# Patient Record
Sex: Male | Born: 1980 | Race: White | Hispanic: No | Marital: Single | Smoking: Current every day smoker
Health system: Southern US, Community
[De-identification: ages and names within clinical notes are randomized; demographics above are authoritative.]

## PROBLEM LIST (undated history)

## (undated) HISTORY — PX: FEMUR FRACTURE SURGERY: SHX633

## (undated) HISTORY — PX: HIP SURGERY: SHX245

---
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
## 1999-12-20 ENCOUNTER — Emergency Department (HOSPITAL_COMMUNITY): Admission: EM | Admit: 1999-12-20 | Discharge: 1999-12-20 | Payer: Self-pay | Admitting: Emergency Medicine

## 2000-02-16 ENCOUNTER — Emergency Department (HOSPITAL_COMMUNITY): Admission: EM | Admit: 2000-02-16 | Discharge: 2000-02-16 | Payer: Self-pay | Admitting: Emergency Medicine

## 2000-02-17 ENCOUNTER — Emergency Department (HOSPITAL_COMMUNITY): Admission: EM | Admit: 2000-02-17 | Discharge: 2000-02-17 | Payer: Self-pay | Admitting: Emergency Medicine

## 2008-06-14 ENCOUNTER — Emergency Department: Payer: Self-pay | Admitting: Emergency Medicine

## 2010-04-11 ENCOUNTER — Emergency Department: Payer: Self-pay | Admitting: Emergency Medicine

## 2010-04-16 ENCOUNTER — Emergency Department: Payer: Self-pay | Admitting: Emergency Medicine

## 2014-08-31 ENCOUNTER — Encounter (HOSPITAL_COMMUNITY): Payer: Self-pay | Admitting: Emergency Medicine

## 2014-08-31 DIAGNOSIS — Y999 Unspecified external cause status: Secondary | ICD-10-CM | POA: Insufficient documentation

## 2014-08-31 DIAGNOSIS — W228XXA Striking against or struck by other objects, initial encounter: Secondary | ICD-10-CM | POA: Insufficient documentation

## 2014-08-31 DIAGNOSIS — S62316A Displaced fracture of base of fifth metacarpal bone, right hand, initial encounter for closed fracture: Secondary | ICD-10-CM | POA: Insufficient documentation

## 2014-08-31 DIAGNOSIS — Y929 Unspecified place or not applicable: Secondary | ICD-10-CM | POA: Insufficient documentation

## 2014-08-31 DIAGNOSIS — Y939 Activity, unspecified: Secondary | ICD-10-CM | POA: Insufficient documentation

## 2014-08-31 DIAGNOSIS — Z720 Tobacco use: Secondary | ICD-10-CM | POA: Insufficient documentation

## 2014-08-31 NOTE — ED Notes (Signed)
Patient here with complaint of right hand injury secondary to punching a wall. Injury occurred about 1 week ago. Hand appears deformed. Tenderness to palpation proximal to 4th and 5th fingers.

## 2014-09-01 ENCOUNTER — Emergency Department (HOSPITAL_COMMUNITY)
Admission: EM | Admit: 2014-09-01 | Discharge: 2014-09-01 | Disposition: A | Discharge status: Home / Self Care | Payer: Self-pay | Attending: Emergency Medicine | Admitting: Emergency Medicine

## 2014-09-01 ENCOUNTER — Emergency Department (HOSPITAL_COMMUNITY): Payer: Self-pay

## 2014-09-01 DIAGNOSIS — S62308A Unspecified fracture of other metacarpal bone, initial encounter for closed fracture: Secondary | ICD-10-CM

## 2014-09-01 MED ORDER — TRAMADOL HCL 50 MG PO TABS: 50.0000 mg | ORAL_TABLET | Freq: Four times a day (QID) | ORAL | Status: DC | PRN

## 2014-09-01 NOTE — ED Provider Notes (Signed)
CSN: [id number]     Arrival date & time 08/31/14  2329 History   First MD Initiated Contact with Patient 09/01/14 0117     Chief Complaint  Patient presents with  . Hand Injury   Oscar Roman is a 34 y.o. male who presents to the ED complaining of right hand pain after punching a wall 7 days ago. He also reports that the tip of his left little finger is starting to feel numb. He reports he has previously injured this hand and has broken his 5th metacarpal before. He rates his pain at 8/10 and worse with movement. He has not taken anything for treatment today. He denies other injuries. He denies fevers, chills, shortness of breath, wheezing,   (Consider location/radiation/quality/duration/timing/severity/associated sxs/prior Treatment) HPI  History reviewed. No pertinent past medical history. Past Surgical History  Procedure Laterality Date  . Hip surgery    . Femur fracture surgery     History reviewed. No pertinent family history. History  Substance Use Topics  . Smoking status: Current Every Day Smoker -- 1.00 packs/day  . Smokeless tobacco: Not on file  . Alcohol Use: No    Review of Systems  Constitutional: Negative for fever.  Respiratory: Negative for cough, shortness of breath and wheezing.   Gastrointestinal: Negative for nausea, vomiting and abdominal pain.  Musculoskeletal: Positive for joint swelling. Negative for back pain and neck pain.       Right hand pain  Skin: Negative for rash.  Neurological: Negative for weakness, light-headedness and numbness.      Allergies  Review of patient's allergies indicates no known allergies.  Home Medications   Prior to Admission medications   Medication Sig Start Date End Date Taking? Authorizing Provider  traMADol (ULTRAM) 50 MG tablet Take 1 tablet (50 mg total) by mouth every 6 (six) hours as needed. 09/01/14   Everlene FarrierWilliam Rashiya Lofland, PA-C   BP 136/93 mmHg  Pulse 91  Temp(Src) 98.4 F (36.9 C) (Oral)  Resp 20  Ht 5\' 8"   (1.727 m)  Wt 185 lb (83.915 kg)  BMI 28.14 kg/m2  SpO2 100% Physical Exam  Constitutional: He appears well-developed and well-nourished. No distress.  HENT:  Head: Normocephalic and atraumatic.  Eyes: Conjunctivae are normal. Pupils are equal, round, and reactive to light. Right eye exhibits no discharge. Left eye exhibits no discharge.  Neck: Normal range of motion.  Cardiovascular: Normal rate, regular rhythm, normal heart sounds and intact distal pulses.   Bilateral radial pulses are intact.  Pulmonary/Chest: Effort normal and breath sounds normal. No respiratory distress. He has no wheezes. He has no rales.  Abdominal: Soft. There is no tenderness.  Musculoskeletal: He exhibits edema and tenderness.  Edema noted to the dorsal aspect of his right hand over his fourth and fifth metacarpals. He is tender over his fourth and fifth metacarpals. There is no open wound. The patient's radial, median and ulnar nerves are intact. Patient has slight decreased sensation in the distal tip of his right finger #5. Strong radial pulse. Good capillary refill. No wrist tenderness. Full ROM of his right wrist.   Lymphadenopathy:    He has no cervical adenopathy.  Neurological: He is alert. Coordination normal.  Skin: Skin is warm and dry. No rash noted. He is not diaphoretic. No erythema. No pallor.  Psychiatric: He has a normal mood and affect. His behavior is normal.  Nursing note and vitals reviewed.   ED Course  Procedures (including critical care time) Labs Review Labs Reviewed -  No data to display  Imaging Review Dg Hand Complete Right  09/01/2014   CLINICAL DATA:  Punched wall with right hand, with swelling and pain over the fifth metacarpal. Initial encounter.  EXAM: RIGHT HAND - COMPLETE 3+ VIEW  COMPARISON:  None.  FINDINGS: There is a mildly comminuted fracture near the base of the fifth metacarpal, with mild displacement. No definite intra-articular extension is seen, though it cannot be  excluded. Overlying soft tissue swelling is noted.  The joint spaces are otherwise preserved. The carpal rows are intact, and demonstrate normal alignment. Positive ulnar variance is noted.  IMPRESSION: 1. Mildly comminuted fracture near the base of fifth metacarpal, with mild displacement. No definite intra-articular extension seen, though it cannot be excluded. 2. Positive ulnar variance noted.   Electronically Signed   By: Roanna Raider M.D.   On: 09/01/2014 01:21     EKG Interpretation None      Filed Vitals:   08/31/14 2342  BP: 136/93  Pulse: 91  Temp: 98.4 F (36.9 C)  TempSrc: Oral  Resp: 20  Height:  (1.727 m)  Weight: 185 lb (83.915 kg)  SpO2: 100%     MDM   Meds given in ED:  Medications - No data to display  New Prescriptions   TRAMADOL (ULTRAM) 50 MG TABLET    Take 1 tablet (50 mg total) by mouth every 6 (six) hours as needed.    Final diagnoses:  Closed fracture of 5th metacarpal, initial encounter   This is a 34 year old male who presents the emergency department complaining of right hand pain after punching the wall 7 days ago. The patient has edema and tenderness to palpation over the dorsal aspect of his right hand over his fourth and fifth metacarpals. He has a strong radial pulse and capillary refill is normal. The patient's radial, median and ulnar nerves are intact. He does have very slight decreased sensation in the distal part of his fifth finger. X-ray indicates a mildly comminuted fracture near the base of the fifth metacarpal with mild displacement. There is no definite intra-articular extension seen. After discussing with my attending Dr. Preston Fleeting, will place in ulnar gutter splint and have him follow-up with hand surgeon Dr. Merlyn Lot. I advised the patient of this plan. We'll provide the patient with a prescription for tramadol. Patient did not wish for any pain medicine in the emergency department. I advised the patient to follow-up with hand surgery  this week. I advised the patient to return to the emergency department with new or worsening symptoms or new concerns. The patient verbalized understanding and agreement with plan.   This patient was discussed with Dr. Preston Fleeting who agrees with assessment and plan.    Everlene Farrier, PA-C 09/01/14 1610  Dione Booze, MD 09/01/14 [phone]

## 2014-09-01 NOTE — Discharge Instructions (Signed)
5th metacarpal fracture You have a break (fracture) of the fifth metacarpal bone at the base.  This is the bone in the hand where the little finger attaches. The fracture is in the end of that bone, closest to the little finger. It is usually caused when you hit an object with a clenched fist. Often, the knuckle is pushed down by the impact. Sometimes, the fracture rotates out of position. A boxer's fracture will usually heal within 6 weeks, if it is treated properly and protected from re-injury. Surgery is sometimes needed. A cast, splint, or bulky hand dressing may be used to protect and immobilize a boxer's fracture. Do not remove this device or dressing until your caregiver approves. Keep your hand elevated, and apply ice packs for 15-20 minutes every 2 hours, for the first 2 days. Elevation and ice help reduce swelling and relieve pain. See your caregiver, or an orthopedic specialist, for follow-up care within the next 10 days. This is to make sure your fracture is healing properly. Document Released: 06/02/2005 Document Revised: 08/25/2011 Document Reviewed: 11/20/2006 [facility] Patient Information 2015 [place], [place]. This information is not intended to replace advice given to you by your health care provider. Make sure you discuss any questions you have with your health care provider.  Cast or Splint Care Casts and splints support injured limbs and keep bones from moving while they heal. It is important to care for your cast or splint at home.  HOME CARE INSTRUCTIONS  Keep the cast or splint uncovered during the drying period. It can take 24 to 48 hours to dry if it is made of plaster. A fiberglass cast will dry in less than 1 hour.  Do not rest the cast on anything harder than a pillow for the first 24 hours.  Do not put weight on your injured limb or apply pressure to the cast until your health care provider gives you permission.  Keep the cast or splint dry. Wet casts or splints can lose  their shape and may not support the limb as well. A wet cast that has lost its shape can also create harmful pressure on your skin when it dries. Also, wet skin can become infected.  Cover the cast or splint with a plastic bag when bathing or when out in the rain or snow. If the cast is on the trunk of the body, take sponge baths until the cast is removed.  If your cast does become wet, dry it with a towel or a blow dryer on the cool setting only.  Keep your cast or splint clean. Soiled casts may be wiped with a moistened cloth.  Do not place any hard or soft foreign objects under your cast or splint, such as cotton, toilet paper, lotion, or powder.  Do not try to scratch the skin under the cast with any object. The object could get stuck inside the cast. Also, scratching could lead to an infection. If itching is a problem, use a blow dryer on a cool setting to relieve discomfort.  Do not trim or cut your cast or remove padding from inside of it.  Exercise all joints next to the injury that are not immobilized by the cast or splint. For example, if you have a long leg cast, exercise the hip joint and toes. If you have an arm cast or splint, exercise the shoulder, elbow, thumb, and fingers.  Elevate your injured arm or leg on 1 or 2 pillows for the first 1  to 3 days to decrease swelling and pain.It is best if you can comfortably elevate your cast so it is higher than your heart. SEEK MEDICAL CARE IF:   Your cast or splint cracks.  Your cast or splint is too tight or too loose.  You have unbearable itching inside the cast.  Your cast becomes wet or develops a soft spot or area.  You have a bad smell coming from inside your cast.  You get an object stuck under your cast.  Your skin around the cast becomes red or raw.  You have new pain or worsening pain after the cast has been applied. SEEK IMMEDIATE MEDICAL CARE IF:   You have fluid leaking through the cast.  You are unable to  move your fingers or toes.  You have discolored (blue or white), cool, painful, or very swollen fingers or toes beyond the cast.  You have tingling or numbness around the injured area.  You have severe pain or pressure under the cast.  You have any difficulty with your breathing or have shortness of breath.  You have chest pain. Document Released: 05/30/2000 Document Revised: 03/23/2013 Document Reviewed: 12/09/2012 [facility] Patient Information 2015 [place]. This information is not intended to replace advice given to you by your health care provider. Make sure you discuss any questions you have with your health care provider.

## 2014-09-09 ENCOUNTER — Encounter (HOSPITAL_COMMUNITY): Payer: Self-pay | Admitting: Emergency Medicine

## 2014-09-09 ENCOUNTER — Emergency Department (HOSPITAL_COMMUNITY)
Admission: EM | Admit: 2014-09-09 | Discharge: 2014-09-09 | Disposition: A | Discharge status: Home / Self Care | Payer: Self-pay | Attending: Emergency Medicine | Admitting: Emergency Medicine

## 2014-09-09 DIAGNOSIS — K088 Other specified disorders of teeth and supporting structures: Secondary | ICD-10-CM | POA: Insufficient documentation

## 2014-09-09 DIAGNOSIS — Z720 Tobacco use: Secondary | ICD-10-CM | POA: Insufficient documentation

## 2014-09-09 DIAGNOSIS — K0889 Other specified disorders of teeth and supporting structures: Secondary | ICD-10-CM

## 2014-09-09 DIAGNOSIS — J029 Acute pharyngitis, unspecified: Secondary | ICD-10-CM | POA: Insufficient documentation

## 2014-09-09 DIAGNOSIS — H9209 Otalgia, unspecified ear: Secondary | ICD-10-CM | POA: Insufficient documentation

## 2014-09-09 LAB — RAPID STREP SCREEN (MED CTR MEBANE ONLY): Streptococcus, Group A Screen (Direct): NEGATIVE

## 2014-09-09 MED ORDER — PENICILLIN V POTASSIUM 250 MG PO TABS: 250.0000 mg | ORAL_TABLET | Freq: Four times a day (QID) | ORAL | Status: AC

## 2014-09-09 MED ORDER — HYDROCODONE-ACETAMINOPHEN 5-325 MG PO TABS: 1.0000 | ORAL_TABLET | Freq: Four times a day (QID) | ORAL | Status: DC | PRN

## 2014-09-09 NOTE — ED Provider Notes (Signed)
CSN: [id number]     Arrival date & time 09/09/14  1627 History  This chart was scribed for Roxy Horsemanobert Skiler Tye, PA-C working with Donnetta HutchingBrian Cook, MD by Evon Slackerrance Branch, ED Scribe. This patient was seen in room TR08C/TR08C and the patient's care was started at 4:44 PM.     Chief Complaint  Patient presents with  . Dental Pain  . Otalgia  . Sore Throat   The history is provided by the patient. No language interpreter was used.   HPI Comments: Oscar Roman is a 34 y.o. male who presents to the Emergency Department complaining of left sided dental pain onset 5 days prior. Pt states that the pain is radiating up into his left ear. Pt reports sore throat as well. Pt states that it is difficulty to swallow due to the pain. Pt states that he has tried tylenol and tramadol with no relief. Pt has also tried salt water gargles with no relief. Pt states that he feels as if he is developing a dental abscess and is requesting antibiotics. Pt denies fever, chills or other related symptoms.   History reviewed. No pertinent past medical history. Past Surgical History  Procedure Laterality Date  . Hip surgery    . Femur fracture surgery     No family history on file. History  Substance Use Topics  . Smoking status: Current Every Day Smoker -- 1.00 packs/day  . Smokeless tobacco: Not on file  . Alcohol Use: No    Review of Systems  Constitutional: Negative for fever and chills.  HENT: Positive for dental problem, ear pain and sore throat.   All other systems reviewed and are negative.    Allergies  Review of patient's allergies indicates no known allergies.  Home Medications   Prior to Admission medications   Medication Sig Start Date End Date Taking? Authorizing Provider  traMADol (ULTRAM) 50 MG tablet Take 1 tablet (50 mg total) by mouth every 6 (six) hours as needed. 09/01/14   Everlene FarrierWilliam Dansie, PA-C   BP 162/104 mmHg  Pulse 89  Temp(Src) 98.3 F (36.8 C) (Oral)  Resp 16  Ht 5\' 8"  (1.727 m)   Wt 180 lb (81.647 kg)  BMI 27.38 kg/m2  SpO2 99%   Physical Exam  Constitutional: He is oriented to person, place, and time. He appears well-developed and well-nourished. No distress.  HENT:  Head: Normocephalic and atraumatic.  Mouth/Throat:    Poor dentition throughout.  Affected tooth as diagrammed.  No signs of peritonsillar or tonsillar abscess.  No signs of gingival abscess. Oropharynx is clear and without exudates.  Uvula is midline.  Airway is intact. No signs of Ludwig's angina with palpation of oral and sublingual mucosa.   Eyes: Conjunctivae and EOM are normal.  Neck: Neck supple. No tracheal deviation present.  Cardiovascular: Normal rate.   Pulmonary/Chest: Effort normal. No respiratory distress.  Musculoskeletal: Normal range of motion.  Neurological: He is alert and oriented to person, place, and time.  Skin: Skin is warm and dry.  Psychiatric: He has a normal mood and affect. His behavior is normal.  Nursing note and vitals reviewed.   ED Course  Procedures (including critical care time) DIAGNOSTIC STUDIES: Oxygen Saturation is 99% on RA, normal by my interpretation.    COORDINATION OF CARE: 5:00 PM-Discussed treatment plan with pt at bedside and pt agreed to plan.     Labs Review Labs Reviewed  RAPID STREP SCREEN    Imaging Review No results found.   EKG  Interpretation None      MDM   Final diagnoses:  Pain, dental    Patient with toothache.  No gross abscess.  Exam unconcerning for Ludwig's angina or spread of infection.  Will treat with penicillin and pain medicine.  Urged patient to follow-up with dentist.    I personally performed the services described in this documentation, which was scribed in my presence. The recorded information has been reviewed and is accurate.       Roxy Horseman, PA-C 09/09/14 1717  Donnetta Hutching, MD 09/09/14 [phone]

## 2014-09-09 NOTE — Discharge Instructions (Signed)

## 2014-09-09 NOTE — ED Notes (Addendum)
Pt c/o pain to teeth on left side, also pain to left ear and sore throat. Pt has tried tylenol and tramadol without relief.

## 2014-09-11 LAB — CULTURE, GROUP A STREP: Strep A Culture: NEGATIVE

## 2014-09-12 ENCOUNTER — Telehealth (HOSPITAL_COMMUNITY): Payer: Self-pay

## 2018-10-02 ENCOUNTER — Emergency Department
Admission: EM | Admit: 2018-10-02 | Discharge: 2018-10-02 | Disposition: A | Discharge status: Home / Self Care | Payer: Self-pay | Attending: Emergency Medicine | Admitting: Emergency Medicine

## 2018-10-02 ENCOUNTER — Other Ambulatory Visit: Payer: Self-pay

## 2018-10-02 ENCOUNTER — Emergency Department: Payer: Self-pay

## 2018-10-02 ENCOUNTER — Encounter: Payer: Self-pay | Admitting: Emergency Medicine

## 2018-10-02 DIAGNOSIS — Y9389 Activity, other specified: Secondary | ICD-10-CM | POA: Insufficient documentation

## 2018-10-02 DIAGNOSIS — W11XXXA Fall on and from ladder, initial encounter: Secondary | ICD-10-CM | POA: Insufficient documentation

## 2018-10-02 DIAGNOSIS — Y929 Unspecified place or not applicable: Secondary | ICD-10-CM | POA: Insufficient documentation

## 2018-10-02 DIAGNOSIS — M79671 Pain in right foot: Secondary | ICD-10-CM | POA: Insufficient documentation

## 2018-10-02 DIAGNOSIS — Y999 Unspecified external cause status: Secondary | ICD-10-CM | POA: Insufficient documentation

## 2018-10-02 MED ORDER — MELOXICAM 15 MG PO TABS: 15.0000 mg | ORAL_TABLET | Freq: Every day | ORAL | 1 refills | Status: AC

## 2018-10-02 MED ORDER — HYDROCODONE-ACETAMINOPHEN 5-325 MG PO TABS: 1.0000 | ORAL_TABLET | Freq: Four times a day (QID) | ORAL | 0 refills | Status: AC | PRN

## 2018-10-02 MED ORDER — ONDANSETRON 4 MG PO TBDP
4.0000 mg | ORAL_TABLET | Freq: Once | ORAL | Status: AC
  Administered 2018-10-02: 4 mg via ORAL
  Filled 2018-10-02: qty 1

## 2018-10-02 MED ORDER — MORPHINE SULFATE (PF) 4 MG/ML IV SOLN
4.0000 mg | Freq: Once | INTRAVENOUS | Status: AC
  Administered 2018-10-02: 17:00:00 4 mg via INTRAMUSCULAR
  Filled 2018-10-02: qty 1

## 2018-10-02 NOTE — ED Provider Notes (Signed)
[facility] Emergency Department Provider Note  ____________________________________________  Time seen: Approximately 6:06 PM  I have reviewed the triage vital signs and the nursing notes.   HISTORY  Chief Complaint Foot Injury    HPI Oscar Roman is a 38 y.o. male presents to the emergency department with 10 out of 10 pain along the dorsal aspect of the right foot.  Patient reports that he was approximately 2 rungs up on the ladder when his foot twisted and became caught.  Patient fell to the ground but did not hit his head or his neck.  Patient has noticed significant swelling along the dorsal aspect of the foot since injury occurred and has had difficulty bearing weight.  No numbness or tingling in the right foot.  Patient denies a history of right foot issues in the past. No other alleviating measures have been attempted.    History reviewed. No pertinent past medical history.  There are no active problems to display for this patient.   Past Surgical History:  Procedure Laterality Date  . FEMUR FRACTURE SURGERY    . HIP SURGERY      Prior to Admission medications   Medication Sig Start Date End Date Taking? Authorizing Provider  HYDROcodone-acetaminophen (NORCO) 5-325 MG tablet Take 1 tablet by mouth every 6 (six) hours as needed for up to 3 days for moderate pain. 10/02/18 10/05/18  Orvil FeilWoods, Graciella Arment M, PA-C  meloxicam (MOBIC) 15 MG tablet Take 1 tablet (15 mg total) by mouth daily for 7 days. 10/02/18 10/09/18  Orvil FeilWoods, Nariya Neumeyer M, PA-C  traMADol (ULTRAM) 50 MG tablet Take 1 tablet (50 mg total) by mouth every 6 (six) hours as needed. 09/01/14   Everlene Farrieransie, William, PA-C    Allergies Patient has no known allergies.  History reviewed. No pertinent family history.  Social History Social History   Tobacco Use  . Smoking status: Current Every Day Smoker    Packs/day: 1.00  . Smokeless tobacco: Never Used  Substance Use Topics  . Alcohol use: No  . Drug  use: No     Review of Systems  Constitutional: No fever/chills Eyes: No visual changes. No discharge ENT: No upper respiratory complaints. Cardiovascular: no chest pain. Respiratory: no cough. No SOB. Gastrointestinal: No abdominal pain.  No nausea, no vomiting.  No diarrhea.  No constipation. Musculoskeletal: Patient has right foot pain.  Skin: Negative for rash, abrasions, lacerations, ecchymosis. Neurological: Negative for headaches, focal weakness or numbness.  ____________________________________________   PHYSICAL EXAM:  VITAL SIGNS: ED Triage Vitals  Enc Vitals Group     BP 10/02/18 1632 (!) 141/86     Pulse Rate 10/02/18 1632 (!) 104     Resp 10/02/18 1632 16     Temp 10/02/18 1632 98.2 F (36.8 C)     Temp Source 10/02/18 1632 Oral     SpO2 10/02/18 1632 96 %     Weight 10/02/18 1626 185 lb (83.9 kg)     Height 10/02/18 1626 5\' 8"  (1.727 m)     Head Circumference --      Peak Flow --      Pain Score 10/02/18 1625 10     Pain Loc --      Pain Edu? --      Excl. in GC? --      Constitutional: Alert and oriented. Well appearing and in no acute distress. Eyes: Conjunctivae are normal. PERRL. EOMI. Head: Atraumatic. Cardiovascular: Normal rate, regular rhythm. Normal S1 and S2.  Good peripheral circulation. Respiratory: Normal respiratory effort without tachypnea or retractions. Lungs CTAB. Good air entry to the bases with no decreased or absent breath sounds. Musculoskeletal: Patient is able to perform full range of motion at the right ankle.  He is able to move all 5 right toes.  He has significant swelling along the dorsal aspect of the right foot.  Palpable dorsalis pedis pulse, right. Neurologic:  Normal speech and language. No gross focal neurologic deficits are appreciated.  Skin:  Skin is warm, dry and intact. No rash noted. Psychiatric: Mood and affect are normal. Speech and behavior are normal. Patient exhibits appropriate insight and  judgement.   ____________________________________________   LABS (all labs ordered are listed, but only abnormal results are displayed)  Labs Reviewed - No data to display ____________________________________________  EKG   ____________________________________________  RADIOLOGY I personally viewed and evaluated these images as part of my medical decision making, as well as reviewing the written report by the radiologist  Dg Ankle Complete Right  Result Date: 10/02/2018 CLINICAL DATA:  Pain after fall. EXAM: RIGHT ANKLE - COMPLETE 3+ VIEW COMPARISON:  None. FINDINGS: There is no evidence of fracture, dislocation, or joint effusion. There is no evidence of arthropathy or other focal bone abnormality. Soft tissues are unremarkable. IMPRESSION: Negative. Electronically Signed   By: Gerome Sam III M.D   On: 10/02/2018 17:04   Dg Foot Complete Right  Result Date: 10/02/2018 CLINICAL DATA:  Pain after fall EXAM: RIGHT FOOT COMPLETE - 3+ VIEW COMPARISON:  None. FINDINGS: There is no evidence of fracture or dislocation. There is no evidence of arthropathy or other focal bone abnormality. Soft tissues are unremarkable. IMPRESSION: Negative. Electronically Signed   By: Gerome Sam III M.D   On: 10/02/2018 17:05    ____________________________________________    PROCEDURES  Procedure(s) performed:    Procedures    Medications  morphine 4 MG/ML injection 4 mg (4 mg Intramuscular Given 10/02/18 1650)  ondansetron (ZOFRAN-ODT) disintegrating tablet 4 mg (4 mg Oral Given 10/02/18 1648)     ____________________________________________   INITIAL IMPRESSION / ASSESSMENT AND PLAN / ED COURSE  Pertinent labs & imaging results that were available during my care of the patient were reviewed by me and considered in my medical decision making (see chart for details).  Review of the [place] CSRS was performed in accordance of the [facility] prior to dispensing any controlled  drugs.  Clinical Course as of Oct 01 1804  Sat Oct 02, 2018  1707 DG Ankle Complete Right [JW]    Clinical Course User Index [JW] Pia Mau M, PA-C     Assessment and plan Right foot pain Patient presents to the emergency department with acute right foot pain after his foot became twisted on a ladder.  On physical exam, patient was tearful and in exquisite pain.  Patient had significant swelling along the dorsal aspect of the right foot.  X-ray examination of the right foot and right ankle revealed no acute bony abnormality.  Patient was placed in a postop shoe and crutches were provided.  He was discharged with a short course of Norco and meloxicam.  He was advised to follow-up with podiatry.  All patient questions were answered.    ____________________________________________  FINAL CLINICAL IMPRESSION(S) / ED DIAGNOSES  Final diagnoses:  Right foot pain      NEW MEDICATIONS STARTED DURING THIS VISIT:  ED Discharge Orders         Ordered    HYDROcodone-acetaminophen (NORCO)  5-325 MG tablet  Every 6 hours PRN     10/02/18 1735    meloxicam (MOBIC) 15 MG tablet  Daily     10/02/18 1735              This chart was dictated using voice recognition software/Dragon. Despite best efforts to proofread, errors can occur which can change the meaning. Any change was purely unintentional.    Orvil Feil, PA-C 10/02/18 1812    Sharman Cheek, MD 10/04/18 [phone]

## 2018-10-02 NOTE — ED Triage Notes (Signed)
Pt was coming down ladder and last couple steps he missed and landed funny on foot.  Significant swelling to right foot.

## 2019-12-21 IMAGING — DX RIGHT ANKLE - COMPLETE 3+ VIEW
3 series · 3 of 3 positions shown · non-contrast
Comparison: None.

CLINICAL DATA: Pain after fall.

EXAM:
RIGHT ANKLE - COMPLETE 3+ VIEW

[ankle ap]
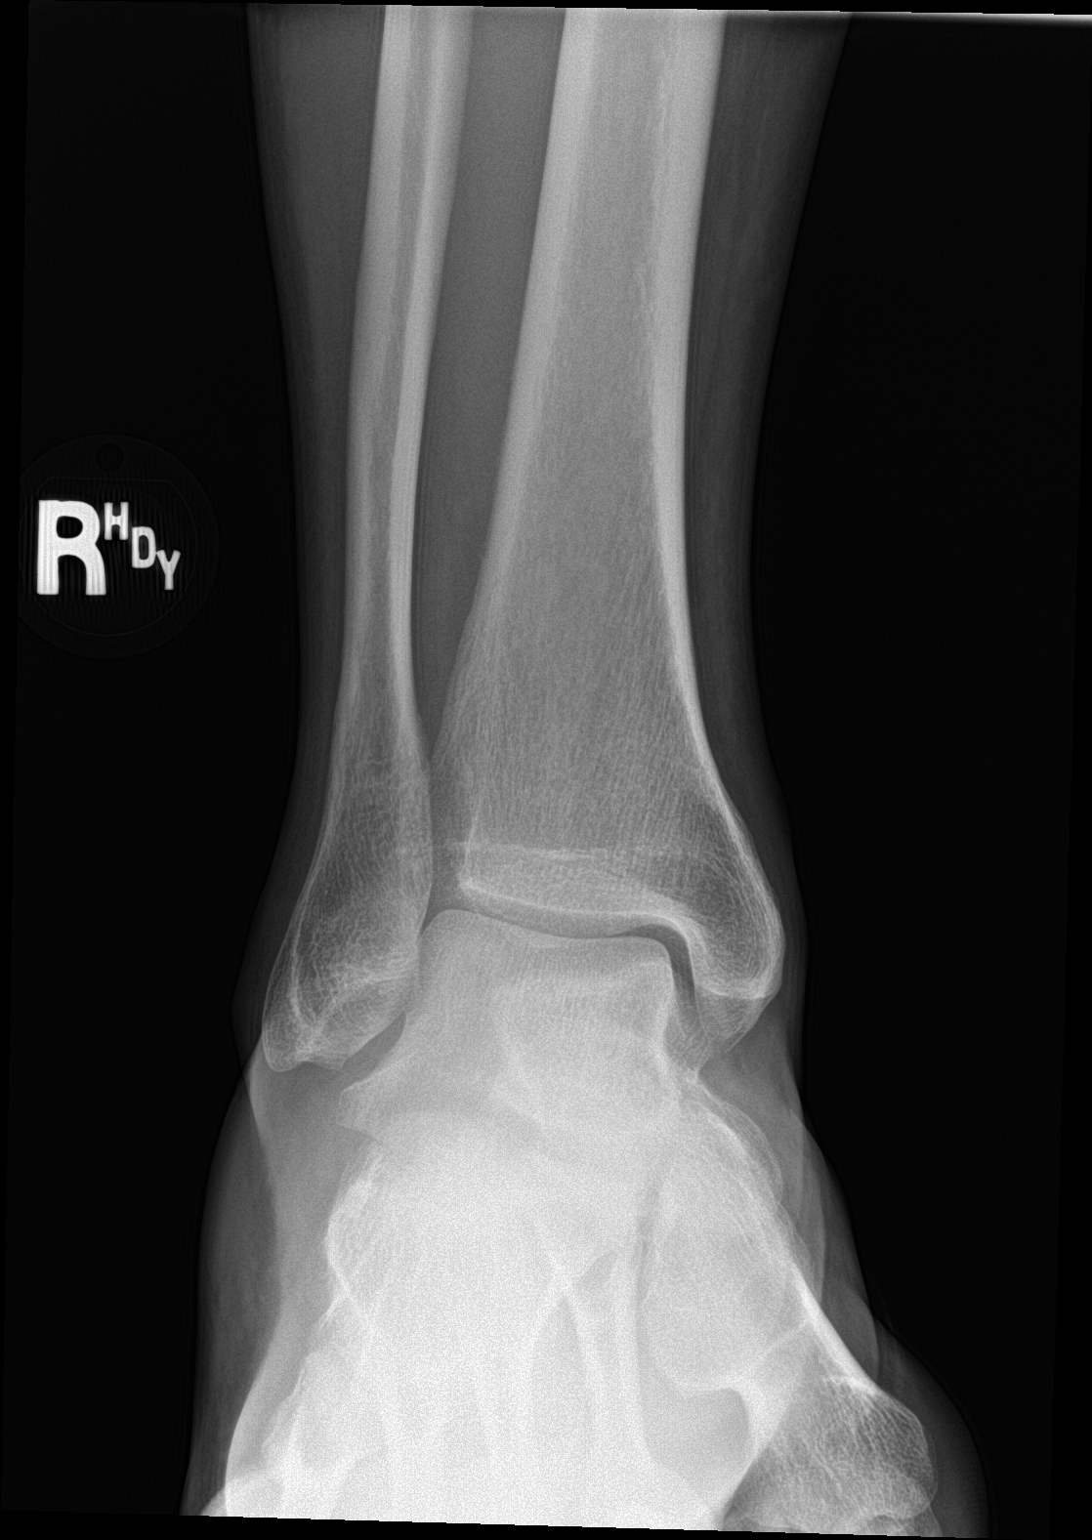

[ankle obl]
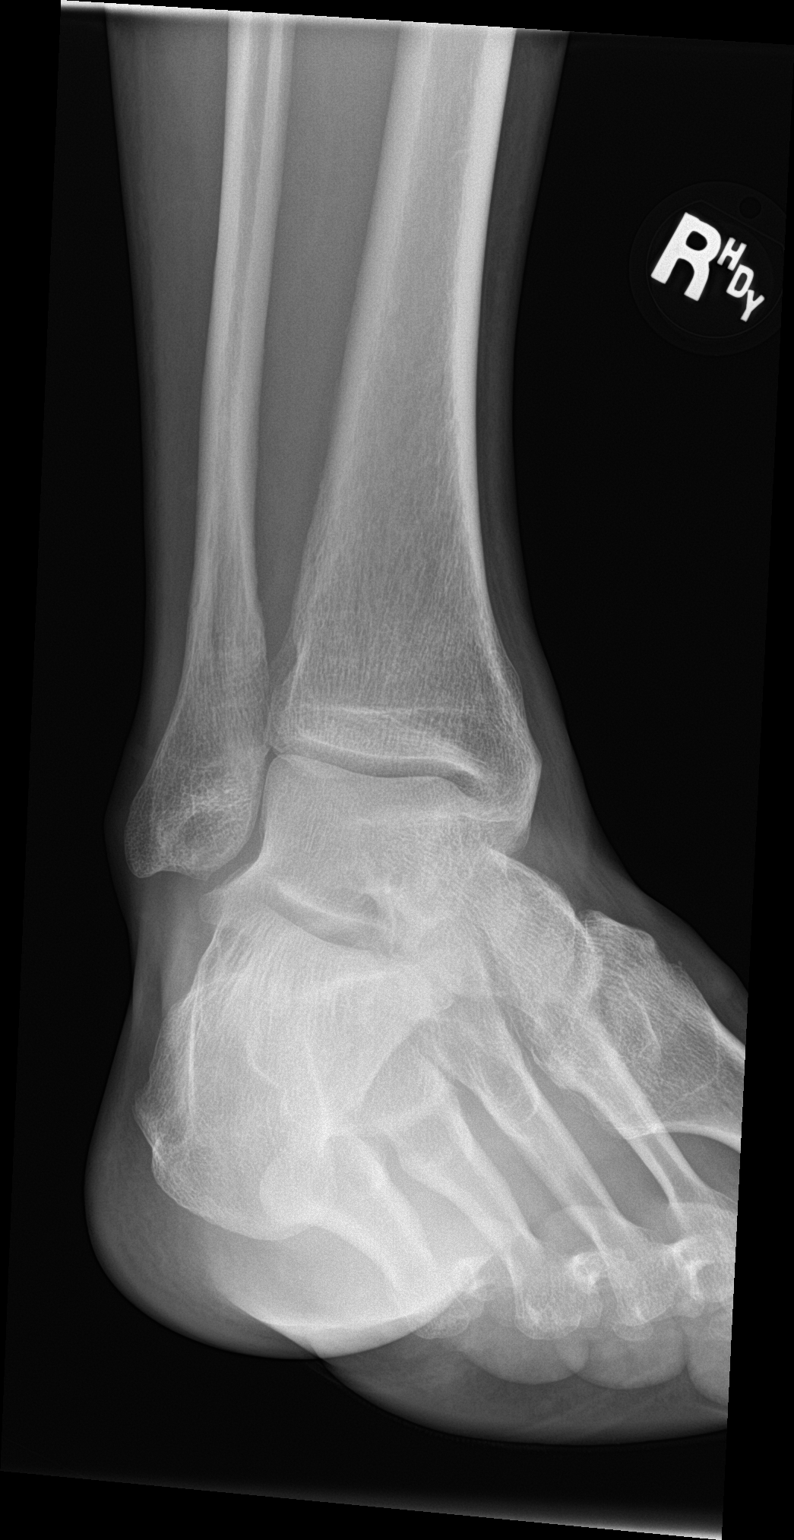

[ankle lat]
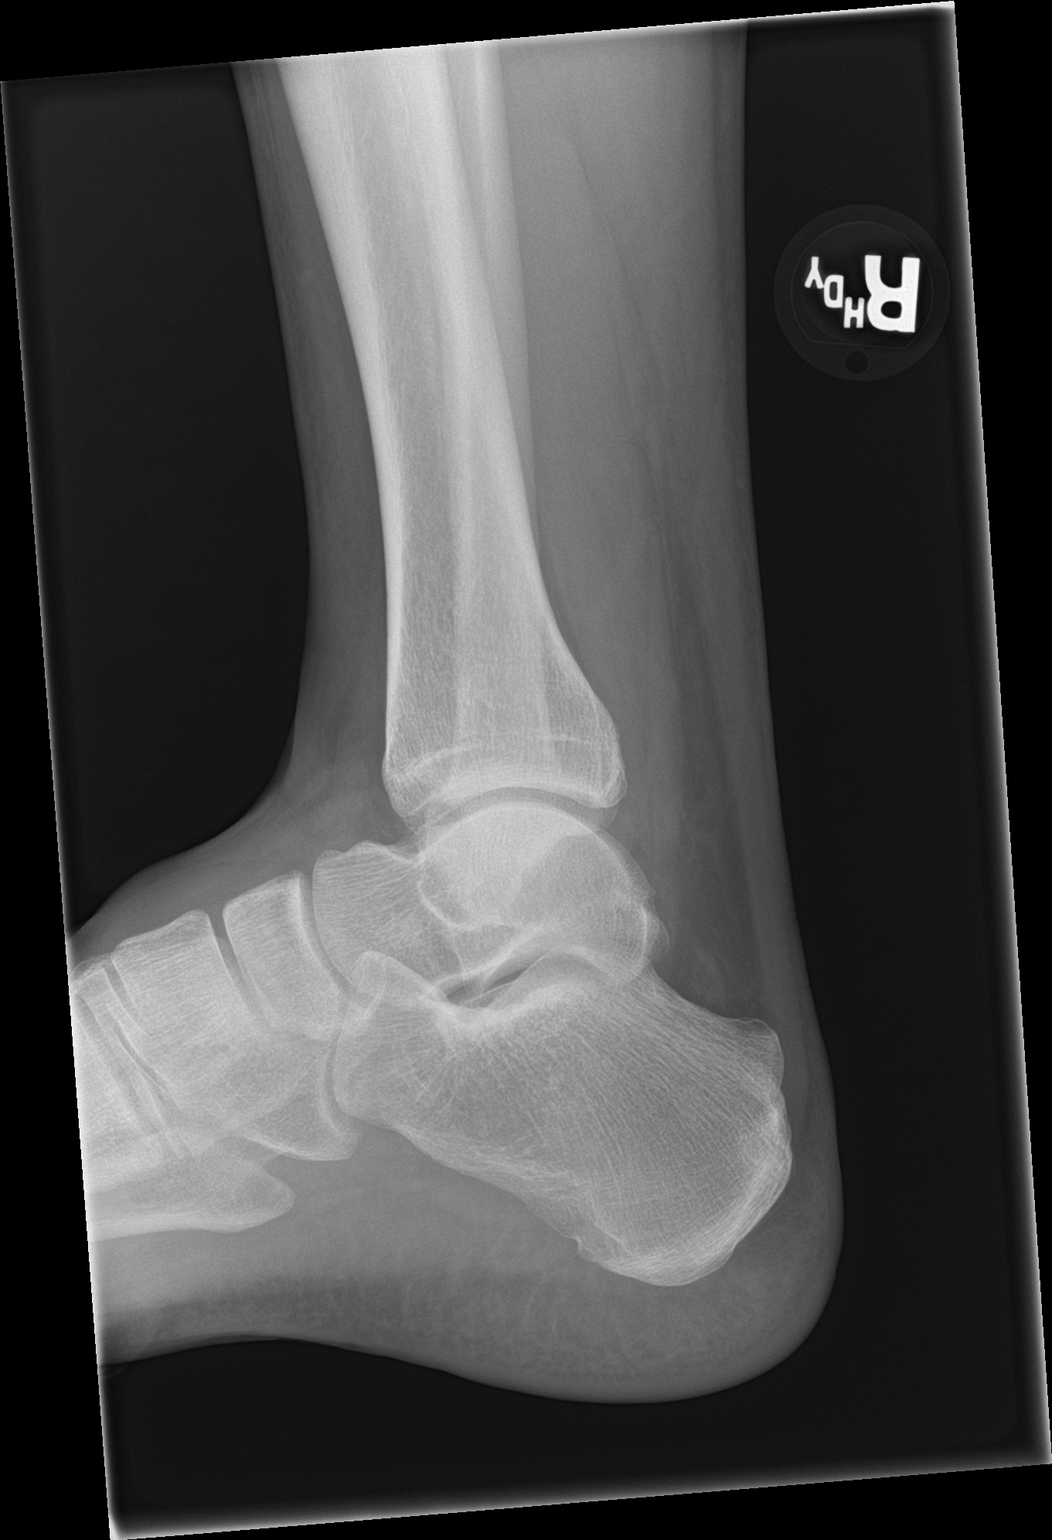

[3 of 3 positions shown; findings below may reference images not displayed]

FINDINGS: There is no evidence of fracture, dislocation, or joint effusion.
There is no evidence of arthropathy or other focal bone abnormality.
Soft tissues are unremarkable.
IMPRESSION: Negative.

## 2019-12-21 IMAGING — DX RIGHT FOOT COMPLETE - 3+ VIEW
3 series · 3 of 3 positions shown · non-contrast
Comparison: None.

CLINICAL DATA: Pain after fall

EXAM:
RIGHT FOOT COMPLETE - 3+ VIEW

[foot ap]
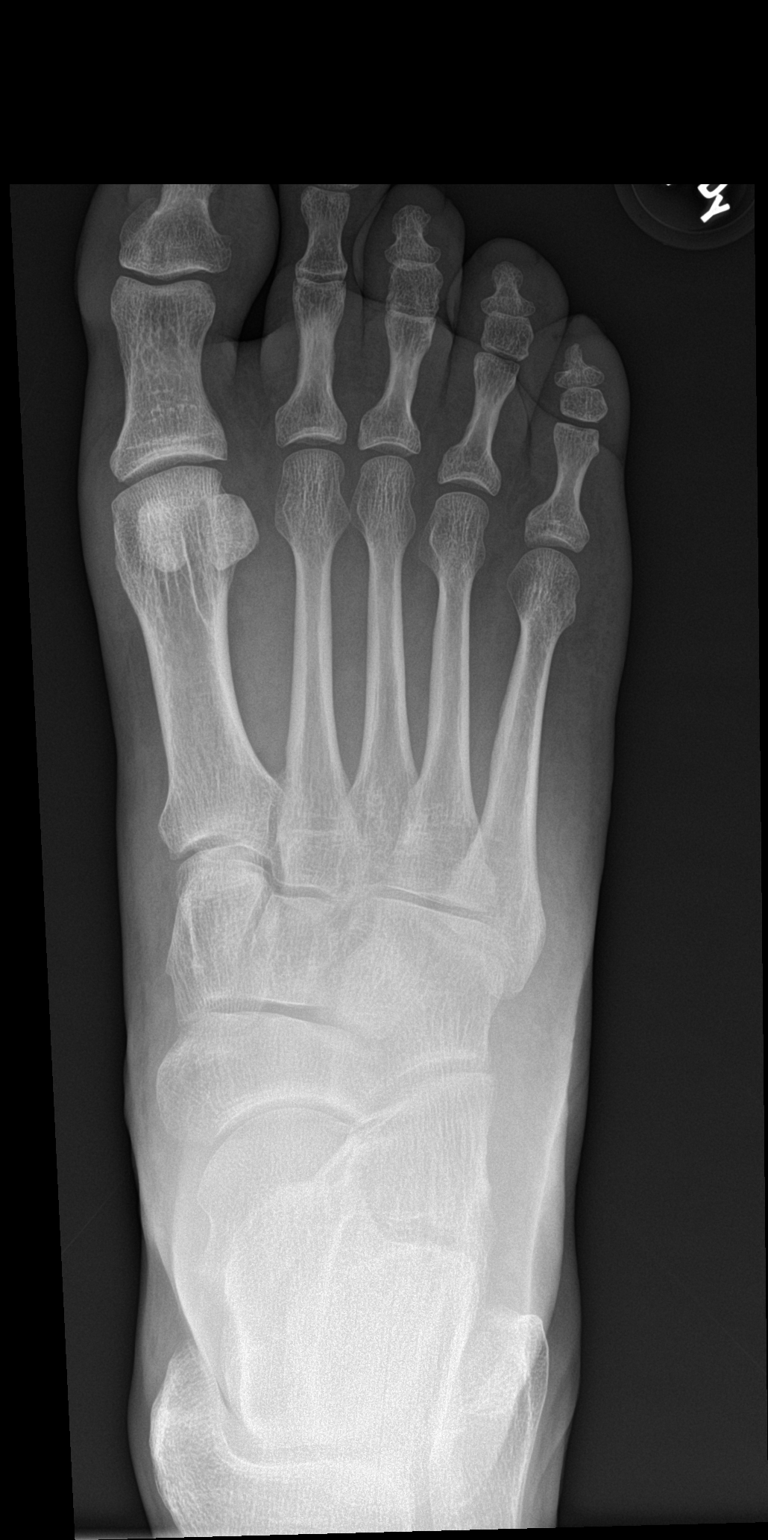

[foot obl]
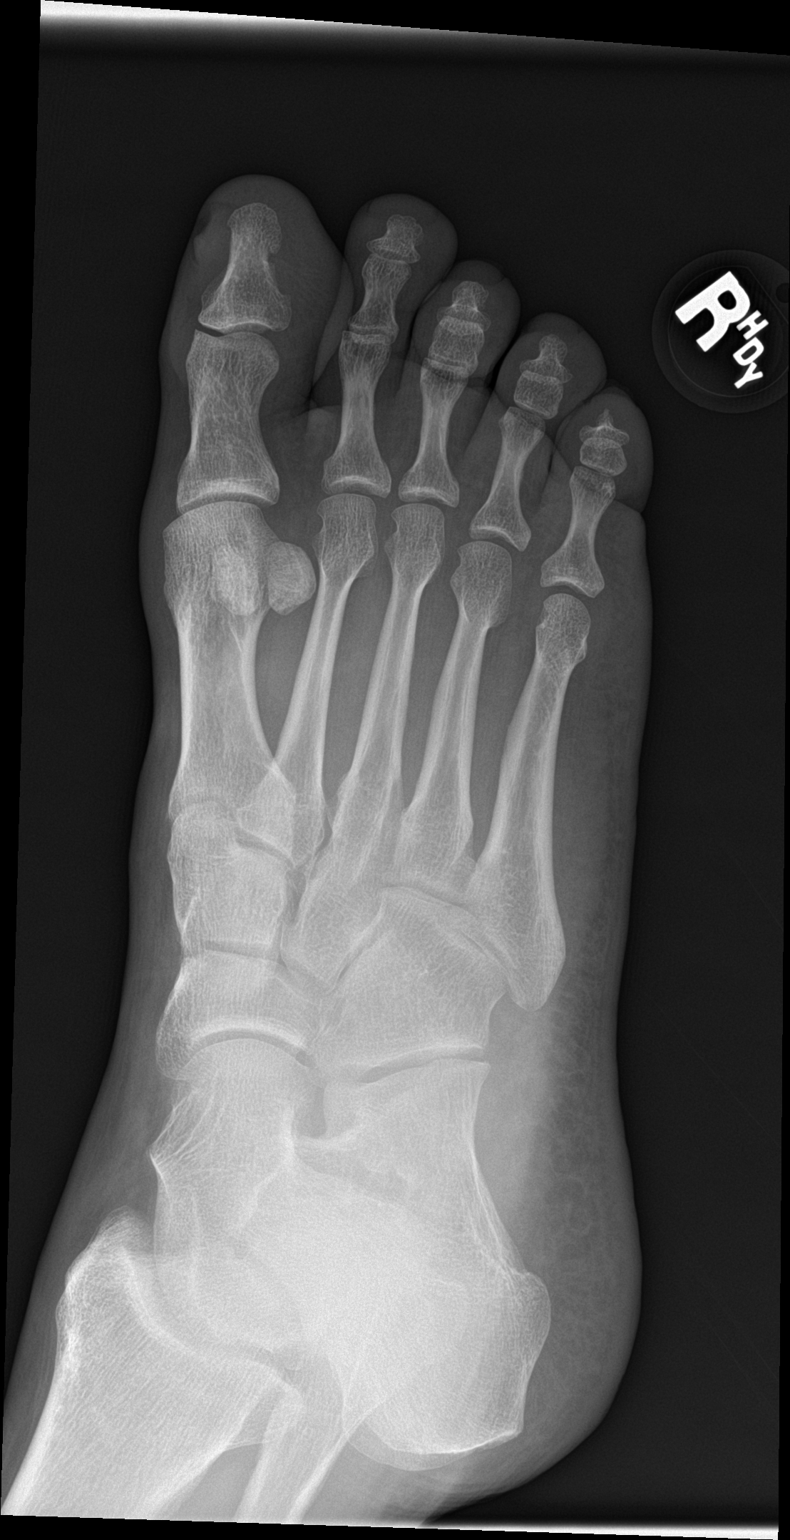

[foot lat]
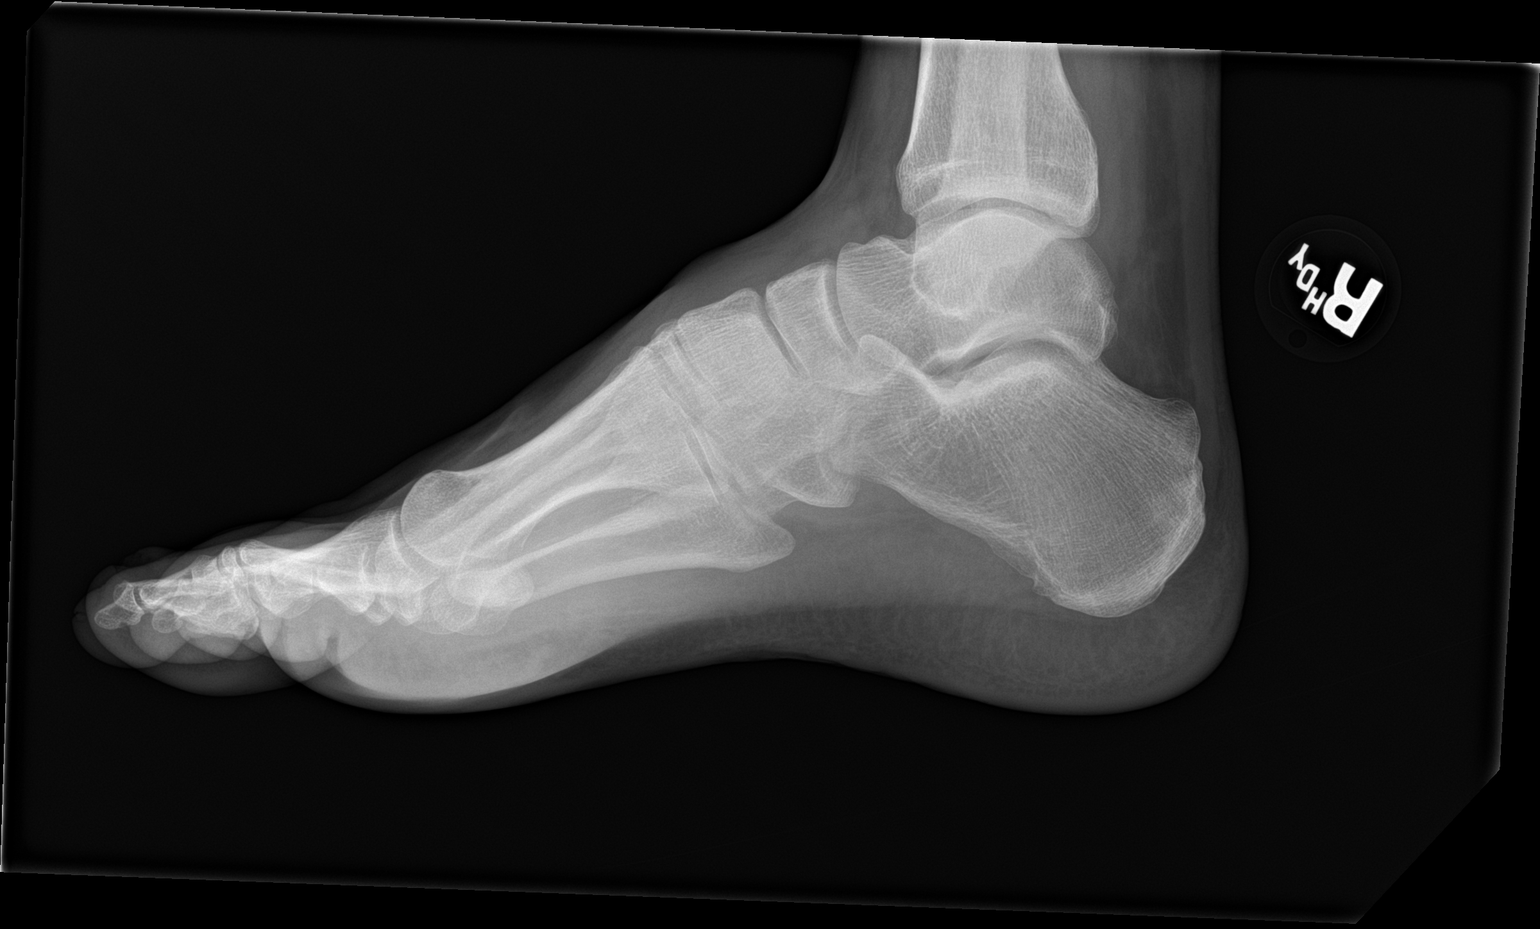

[3 of 3 positions shown; findings below may reference images not displayed]

FINDINGS: There is no evidence of fracture or dislocation. There is no
evidence of arthropathy or other focal bone abnormality. Soft
tissues are unremarkable.
IMPRESSION: Negative.

## 2020-01-10 ENCOUNTER — Emergency Department: Admission: EM | Admit: 2020-01-10 | Discharge: 2020-01-10 | Discharge status: Against Medical Advice | Payer: Self-pay

## 2022-01-28 ENCOUNTER — Encounter: Payer: Self-pay | Admitting: Emergency Medicine

## 2022-01-28 ENCOUNTER — Other Ambulatory Visit: Payer: Self-pay

## 2022-01-28 ENCOUNTER — Emergency Department
Admission: EM | Admit: 2022-01-28 | Discharge: 2022-01-28 | Disposition: A | Discharge status: Home / Self Care | Payer: Self-pay | Attending: Emergency Medicine | Admitting: Emergency Medicine

## 2022-01-28 DIAGNOSIS — K029 Dental caries, unspecified: Secondary | ICD-10-CM | POA: Insufficient documentation

## 2022-01-28 DIAGNOSIS — K0889 Other specified disorders of teeth and supporting structures: Secondary | ICD-10-CM | POA: Insufficient documentation

## 2022-01-28 MED ORDER — KETOROLAC TROMETHAMINE 15 MG/ML IJ SOLN
15.0000 mg | Freq: Once | INTRAMUSCULAR | Status: AC
  Administered 2022-01-28: 15 mg via INTRAMUSCULAR
  Filled 2022-01-28: qty 1

## 2022-01-28 MED ORDER — CHLORHEXIDINE GLUCONATE 0.12 % MT SOLN: 15.0000 mL | Freq: Two times a day (BID) | OROMUCOSAL | 0 refills | Status: AC

## 2022-01-28 MED ORDER — NAPROXEN 500 MG PO TABS: 500.0000 mg | ORAL_TABLET | Freq: Two times a day (BID) | ORAL | 2 refills | Status: AC

## 2022-01-28 MED ORDER — PENICILLIN V POTASSIUM 500 MG PO TABS: 500.0000 mg | ORAL_TABLET | Freq: Three times a day (TID) | ORAL | 0 refills | Status: AC

## 2022-01-28 NOTE — Discharge Instructions (Addendum)
Take the medications as prescribed.  Please follow-up with dentist soon as possible.  Please return for any new, worsening, or change in symptoms or other concerns.

## 2022-01-28 NOTE — ED Provider Notes (Signed)
[facility] Provider Note    Event Date/Time   First MD Initiated Contact with Patient 01/28/22 1722     (approximate)   History   Dental Pain   HPI  Oscar Roman is a 41 y.o. male who presents today for evaluation of dental pain.  Patient reports that this began approximately 3 to 4 days ago.  He reports that he has pain along his left lower molar.  He has not had any facial or neck swelling or erythema.  He reports that the pain radiates into his ear.  No fevers or chills.  No trismus.  No trouble swallowing.  No voice change.  No stiffness in his neck.  There are no problems to display for this patient.         Physical Exam   Triage Vital Signs: ED Triage Vitals  Enc Vitals Group     BP 01/28/22 1628 125/83     Pulse Rate 01/28/22 1628 79     Resp 01/28/22 1628 16     Temp 01/28/22 1628 98.7 F (37.1 C)     Temp Source 01/28/22 1628 Oral     SpO2 01/28/22 1628 100 %     Weight 01/28/22 1636 175 lb (79.4 kg)     Height 01/28/22 1636 5\' 8"  (1.727 m)     Head Circumference --      Peak Flow --      Pain Score 01/28/22 1636 4     Pain Loc --      Pain Edu? --      Excl. in GC? --     Most recent vital signs: Vitals:   01/28/22 1628  BP: 125/83  Pulse: 79  Resp: 16  Temp: 98.7 F (37.1 C)  SpO2: 100%    Physical Exam Vitals and nursing note reviewed.  Constitutional:      General: Awake and alert. No acute distress.    Appearance: Normal appearance. The patient is normal weight.  HENT:     Head: Normocephalic and atraumatic.     Mouth: Mucous membranes are moist.  Tooth #19 with obvious dental caries along the gingiva.  No gingival swelling or fluctuance.  Mild Tenderness noted.  No trismus.  No sublingual swelling.  No facial or neck swelling or erythema.  Full and normal range of motion of the neck.  No voice changes. Eyes:     General: PERRL. Normal EOMs        Right eye: No discharge.        Left eye: No discharge.      Conjunctiva/sclera: Conjunctivae normal.  Cardiovascular:     Rate and Rhythm: Normal rate and regular rhythm.     Pulses: Normal pulses.     Heart sounds: Normal heart sounds Pulmonary:     Effort: Pulmonary effort is normal. No respiratory distress.     Breath sounds: Normal breath sounds.  Abdominal:     Abdomen is soft. There is no abdominal tenderness. No rebound or guarding. No distention. Musculoskeletal:        General: No swelling. Normal range of motion.     Cervical back: Normal range of motion and neck supple.  Skin:    General: Skin is warm and dry.     Capillary Refill: Capillary refill takes less than 2 seconds.     Findings: No rash.  Neurological:     Mental Status: The patient is awake and alert.  ED Results / Procedures / Treatments   Labs (all labs ordered are listed, but only abnormal results are displayed) Labs Reviewed - No data to display   EKG     RADIOLOGY Patient declined    PROCEDURES:  Critical Care performed:   Procedures   MEDICATIONS ORDERED IN ED: Medications  ketorolac (TORADOL) 15 MG/ML injection 15 mg (15 mg Intramuscular Given 01/28/22 1741)     IMPRESSION / MDM / ASSESSMENT AND PLAN / ED COURSE  I reviewed the triage vital signs and the nursing notes.   Differential diagnosis includes, but is not limited to, dental caries vs decay vs pulpitits. No gingival swelling or fluctuance concerning for gingival abscess.  No trismus, nuchal rigidity, neck pain, hot potato voice, uvular deviation or malocclusion to suggest deep space infection. No sublingual swelling concerning for Ludwig's angina.  CT and blood work was ordered in triage, however patient reports that he does not want to do this work-up, and reports that he wants pain control antibiotics only.  He understands the risk of refusing this test.  He has no clinical signs or symptoms of deep space infection at this time, and I feel it is reasonable to not do advanced  imaging.  Patient was started on antibiotics and chlorhexidine mouth rinse.  Patient was treated symptomatically in the emergency department. Discussed care plan, return precautions, and advised close outpatient follow-up with dentist.  He was given dental resources.  Patient agrees with plan of care.   Patient's presentation is most consistent with acute, uncomplicated illness.   Clinical Course as of 01/28/22 2020  Tue Jan 28, 2022  1726 Patient declines imaging and blood work, wants abx and pain control only [JP]    Clinical Course User Index [JP] Fran Neiswonger, Herb Grays, PA-C     FINAL CLINICAL IMPRESSION(S) / ED DIAGNOSES   Final diagnoses:  Pain due to dental caries  Dental caries  Tooth pain     Rx / DC Orders   ED Discharge Orders          Ordered    penicillin v potassium (VEETID) 500 MG tablet  3 times daily        01/28/22 1742    chlorhexidine (PERIDEX) 0.12 % solution  2 times daily        01/28/22 1742    naproxen (NAPROSYN) 500 MG tablet  2 times daily with meals        01/28/22 1742             Note:  This document was prepared using Dragon voice recognition software and may include unintentional dictation errors.   Keturah Shavers 01/28/22 2020    Concha Se, MD 01/29/22 [phone]

## 2022-01-28 NOTE — ED Triage Notes (Signed)
PT in with co toothache

## 2022-01-28 NOTE — ED Provider Triage Note (Signed)
  Emergency Medicine Provider Triage Evaluation Note  Oscar Roman , a 40 y.o.male,  was evaluated in triage.  Pt complains of dental pain in the left lower jaw and upper left jaw.  Patient states that he feels swelling along his neck/throat on the left side.   Review of Systems  Positive: Dental pain, neck swelling Negative: Denies fever, chest pain, vomiting  Physical Exam   Vitals:   01/28/22 1628  BP: 125/83  Pulse: 79  Resp: 16  Temp: 98.7 F (37.1 C)  SpO2: 100%   Gen:   Awake, no distress   Resp:  Normal effort  MSK:   Moves extremities without difficulty  Other:  Extremely poor dentition.  Gumline erythematous in the lower left and upper left side.  Medical Decision Making  Given the patient's initial medical screening exam, the following diagnostic evaluation has been ordered. The patient will be placed in the appropriate treatment space, once one is available, to complete the evaluation and treatment. I have discussed the plan of care with the patient and I have advised the patient that an ED physician or mid-level practitioner will reevaluate their condition after the test results have been received, as the results may give them additional insight into the type of treatment they may need.    Diagnostics: Labs, CT soft tissue neck  Treatments: none immediately   Varney Daily, [place] 01/28/22 1702

## 2023-02-05 ENCOUNTER — Emergency Department
Admission: EM | Admit: 2023-02-05 | Discharge: 2023-02-05 | Disposition: A | Discharge status: Home / Self Care | Payer: BLUE CROSS/BLUE SHIELD | Attending: Emergency Medicine | Admitting: Emergency Medicine

## 2023-02-05 ENCOUNTER — Emergency Department: Payer: BLUE CROSS/BLUE SHIELD

## 2023-02-05 ENCOUNTER — Other Ambulatory Visit: Payer: Self-pay

## 2023-02-05 DIAGNOSIS — S50812A Abrasion of left forearm, initial encounter: Secondary | ICD-10-CM | POA: Insufficient documentation

## 2023-02-05 DIAGNOSIS — R519 Headache, unspecified: Secondary | ICD-10-CM | POA: Diagnosis not present

## 2023-02-05 DIAGNOSIS — S00212A Abrasion of left eyelid and periocular area, initial encounter: Secondary | ICD-10-CM | POA: Insufficient documentation

## 2023-02-05 DIAGNOSIS — S80811A Abrasion, right lower leg, initial encounter: Secondary | ICD-10-CM | POA: Diagnosis not present

## 2023-02-05 DIAGNOSIS — Y92410 Unspecified street and highway as the place of occurrence of the external cause: Secondary | ICD-10-CM | POA: Insufficient documentation

## 2023-02-05 DIAGNOSIS — R079 Chest pain, unspecified: Secondary | ICD-10-CM | POA: Insufficient documentation

## 2023-02-05 DIAGNOSIS — S50811A Abrasion of right forearm, initial encounter: Secondary | ICD-10-CM | POA: Diagnosis not present

## 2023-02-05 DIAGNOSIS — Z23 Encounter for immunization: Secondary | ICD-10-CM | POA: Insufficient documentation

## 2023-02-05 DIAGNOSIS — S80812A Abrasion, left lower leg, initial encounter: Secondary | ICD-10-CM | POA: Diagnosis not present

## 2023-02-05 DIAGNOSIS — S301XXA Contusion of abdominal wall, initial encounter: Secondary | ICD-10-CM | POA: Insufficient documentation

## 2023-02-05 DIAGNOSIS — L089 Local infection of the skin and subcutaneous tissue, unspecified: Secondary | ICD-10-CM

## 2023-02-05 DIAGNOSIS — S3991XA Unspecified injury of abdomen, initial encounter: Secondary | ICD-10-CM | POA: Diagnosis present

## 2023-02-05 LAB — CBC
HCT: 49 % (ref 39.0–52.0)
Hemoglobin: 17 g/dL (ref 13.0–17.0)
MCH: 31.9 pg (ref 26.0–34.0)
MCHC: 34.7 g/dL (ref 30.0–36.0)
MCV: 91.9 fL (ref 80.0–100.0)
Platelets: 371 10*3/uL (ref 150–400)
RBC: 5.33 MIL/uL (ref 4.22–5.81)
RDW: 13.2 % (ref 11.5–15.5)
WBC: 7.3 10*3/uL (ref 4.0–10.5)
nRBC: 0 % (ref 0.0–0.2)

## 2023-02-05 LAB — BASIC METABOLIC PANEL
Anion gap: 11 (ref 5–15)
BUN: 10 mg/dL (ref 6–20)
CO2: 25 mmol/L (ref 22–32)
Calcium: 9.2 mg/dL (ref 8.9–10.3)
Chloride: 102 mmol/L (ref 98–111)
Creatinine, Ser: 0.91 mg/dL (ref 0.61–1.24)
GFR, Estimated: >60 mL/min (ref >60)
Glucose, Bld: 104 mg/dL — ABNORMAL HIGH (ref 70–99)
Potassium: 4.4 mmol/L (ref 3.5–5.1)
Sodium: 138 mmol/L (ref 135–145)

## 2023-02-05 LAB — TROPONIN I (HIGH SENSITIVITY): Troponin I (High Sensitivity): 3 ng/L (ref <18)

## 2023-02-05 MED ORDER — CEPHALEXIN 500 MG PO CAPS: 500.0000 mg | ORAL_CAPSULE | Freq: Four times a day (QID) | ORAL | 0 refills | Status: AC

## 2023-02-05 MED ORDER — TETANUS-DIPHTH-ACELL PERTUSSIS 5-2.5-18.5 LF-MCG/0.5 IM SUSY
0.5000 mL | PREFILLED_SYRINGE | Freq: Once | INTRAMUSCULAR | Status: AC
  Administered 2023-02-05: 0.5 mL via INTRAMUSCULAR
  Filled 2023-02-05: qty 0.5

## 2023-02-05 MED ORDER — IOHEXOL 300 MG/ML  SOLN
100.0000 mL | Freq: Once | INTRAMUSCULAR | Status: AC | PRN
  Administered 2023-02-05: 100 mL via INTRAVENOUS

## 2023-02-05 NOTE — Discharge Instructions (Addendum)
Your CT scans do not show any acute findings.  Your wounds look like they might be getting infected, please take the antibiotics as prescribed.  Return for any new, worsening, or change in symptoms other concerns.  It was a pleasure caring for you today.
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
